# Patient Record
Sex: Male | Born: 1990 | Hispanic: No | Marital: Single | Smoking: Never smoker
Health system: Southern US, Community
[De-identification: ages and names within clinical notes are randomized; demographics above are authoritative.]

---

## 2015-08-21 ENCOUNTER — Other Ambulatory Visit: Payer: Self-pay | Admitting: Infectious Disease

## 2015-08-21 ENCOUNTER — Ambulatory Visit
Admission: RE | Admit: 2015-08-21 | Discharge: 2015-08-21 | Disposition: A | Discharge status: Home / Self Care | Payer: No Typology Code available for payment source | Source: Ambulatory Visit | Attending: Infectious Disease | Admitting: Infectious Disease

## 2015-08-21 DIAGNOSIS — R7611 Nonspecific reaction to tuberculin skin test without active tuberculosis: Secondary | ICD-10-CM

## 2019-01-23 ENCOUNTER — Encounter (HOSPITAL_BASED_OUTPATIENT_CLINIC_OR_DEPARTMENT_OTHER): Payer: Self-pay | Admitting: *Deleted

## 2019-01-23 ENCOUNTER — Emergency Department (HOSPITAL_BASED_OUTPATIENT_CLINIC_OR_DEPARTMENT_OTHER)
Admission: EM | Admit: 2019-01-23 | Discharge: 2019-01-23 | Disposition: A | Discharge status: Home / Self Care | Payer: No Typology Code available for payment source | Attending: Emergency Medicine | Admitting: Emergency Medicine

## 2019-01-23 ENCOUNTER — Other Ambulatory Visit: Payer: Self-pay

## 2019-01-23 ENCOUNTER — Emergency Department (HOSPITAL_BASED_OUTPATIENT_CLINIC_OR_DEPARTMENT_OTHER): Payer: No Typology Code available for payment source

## 2019-01-23 DIAGNOSIS — M25561 Pain in right knee: Secondary | ICD-10-CM | POA: Insufficient documentation

## 2019-01-23 DIAGNOSIS — Y9302 Activity, running: Secondary | ICD-10-CM | POA: Diagnosis not present

## 2019-01-23 DIAGNOSIS — W1842XA Slipping, tripping and stumbling without falling due to stepping into hole or opening, initial encounter: Secondary | ICD-10-CM | POA: Diagnosis not present

## 2019-01-23 DIAGNOSIS — Y998 Other external cause status: Secondary | ICD-10-CM | POA: Insufficient documentation

## 2019-01-23 DIAGNOSIS — Y9289 Other specified places as the place of occurrence of the external cause: Secondary | ICD-10-CM | POA: Insufficient documentation

## 2019-01-23 DIAGNOSIS — W19XXXA Unspecified fall, initial encounter: Secondary | ICD-10-CM

## 2019-01-23 NOTE — Discharge Instructions (Addendum)
You may alternate taking Tylenol and Ibuprofen as needed for pain control. You may take 400-600 mg of ibuprofen every 6 hours and [id number] mg of Tylenol every 6 hours. Do not exceed 4000 mg of Tylenol daily as this can lead to liver damage. Also, make sure to take Ibuprofen with meals as it can cause an upset stomach. Do not take other NSAIDs while taking Ibuprofen such as (Aleve, Naprosyn, Aspirin, Celebrex, etc) and do not take more than the prescribed dose as this can lead to ulcers and bleeding in your GI tract. You may use warm and cold compresses to help with your symptoms.   Please follow up with your primary doctor or an orthopedics within the next 7-10 days for re-evaluation and further treatment of your symptoms if you continue to have pain.   Please return to the ER sooner if you have any new or worsening symptoms.
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
## 2019-09-17 ENCOUNTER — Emergency Department (INDEPENDENT_AMBULATORY_CARE_PROVIDER_SITE_OTHER): Payer: 59

## 2019-09-17 ENCOUNTER — Other Ambulatory Visit: Payer: Self-pay

## 2019-09-17 ENCOUNTER — Emergency Department
Admission: RE | Admit: 2019-09-17 | Discharge: 2019-09-17 | Disposition: A | Discharge status: Home / Self Care | Payer: 59 | Source: Ambulatory Visit

## 2019-09-17 VITALS — BP 123/82 | HR 65 | Temp 98.1°F

## 2019-09-17 DIAGNOSIS — R0781 Pleurodynia: Secondary | ICD-10-CM | POA: Diagnosis not present

## 2019-09-17 DIAGNOSIS — R0789 Other chest pain: Secondary | ICD-10-CM

## 2019-09-17 DIAGNOSIS — Y9368 Activity, volleyball (beach) (court): Secondary | ICD-10-CM | POA: Diagnosis not present

## 2019-09-17 DIAGNOSIS — W1830XA Fall on same level, unspecified, initial encounter: Secondary | ICD-10-CM

## 2019-09-17 MED ORDER — NAPROXEN 500 MG PO TABS: 500.0000 mg | ORAL_TABLET | Freq: Two times a day (BID) | ORAL | 0 refills | Status: AC | PRN

## 2019-09-17 NOTE — Discharge Instructions (Signed)
  Take the prescribed antiinflammatory medication with food to help prevent stomach ulcers and upset.   Use a cool compress 2-3 times daily for 10-15 minutes at a time to help with pain.  Call to schedule an appointment with family medicine in 1 week if not improving, sooner if you develop fever, cough or trouble breathing.

## 2019-09-17 NOTE — ED Provider Notes (Signed)
[facility]    CSN: [id number] Arrival date & time: 09/17/19  1053      History   Chief Complaint Chief Complaint  Patient presents with  . Appointment  . Chest Pain    HPI Nicholas Ford is a 29 y.o. male.   HPI  Nicholas Ford is a 29 y.o. male presenting to UC with c/o left side rib pain for about 3-4 weeks after fall on hard sand while playing beach volleyball.  Pain is aching and sore, 3/10, worse with certain movement. He has taken ibuprofen twice over the last 2 days with no improvement. Denies cough, congestion, fever, or trouble breathing. Denies rash or bruising to the area.    History reviewed. No pertinent past medical history.  There are no problems to display for this patient.   History reviewed. No pertinent surgical history.     Home Medications    Prior to Admission medications   Medication Sig Start Date End Date Taking? Authorizing Provider  naproxen (NAPROSYN) 500 MG tablet Take 1 tablet (500 mg total) by mouth 2 (two) times daily as needed for mild pain or moderate pain. 09/17/19   Lurene Shadow, PA-C    Family History No family history on file.  Social History Social History   Tobacco Use  . Smoking status: Never Smoker  . Smokeless tobacco: Never Used  Substance Use Topics  . Alcohol use: Not Currently  . Drug use: Never     Allergies   Patient has no known allergies.   Review of Systems Review of Systems  Constitutional: Negative for chills and fever.  Respiratory: Negative for chest tightness and shortness of breath.   Cardiovascular: Positive for chest pain (left side). Negative for palpitations.  Skin: Negative for color change and rash.     Physical Exam Triage Vital Signs ED Triage Vitals [09/17/19 1230]  Enc Vitals Group     BP 123/82     Pulse Rate 65     Resp      Temp 98.1 F (36.7 C)     Temp Source Oral     SpO2 99 %     Weight      Height      Head Circumference      Peak Flow        Pain Score 3     Pain Loc      Pain Edu?      Excl. in GC?    No data found.  Updated Vital Signs BP 123/82 (BP Location: Left Arm)   Pulse 65   Temp 98.1 F (36.7 C) (Oral)   SpO2 99%   Visual Acuity Right Eye Distance:   Left Eye Distance:   Bilateral Distance:    Right Eye Near:   Left Eye Near:    Bilateral Near:     Physical Exam Vitals and nursing note reviewed.  Constitutional:      Appearance: He is well-developed.  HENT:     Head: Normocephalic and atraumatic.  Cardiovascular:     Rate and Rhythm: Normal rate and regular rhythm.  Pulmonary:     Effort: Pulmonary effort is normal.     Breath sounds: No decreased breath sounds, wheezing, rhonchi or rales.  Chest:     Chest wall: Tenderness present. No mass, deformity, crepitus or edema.  Musculoskeletal:        General: Normal range of motion.     Cervical back: Normal range of  motion.  Skin:    General: Skin is warm and dry.     Findings: No ecchymosis, erythema or rash.  Neurological:     Mental Status: He is alert and oriented to person, place, and time.  Psychiatric:        Behavior: Behavior normal.      UC Treatments / Results  Labs (all labs ordered are listed, but only abnormal results are displayed) Labs Reviewed - No data to display  EKG   Radiology DG Ribs Unilateral W/Chest Left  Result Date: 09/17/2019 CLINICAL DATA:  LEFT chest and rib pain following fall 3 weeks ago. Initial encounter. EXAM: LEFT RIBS AND CHEST - 3+ VIEW COMPARISON:  08/21/2015 chest radiograph FINDINGS: No fracture or other bone lesions are seen involving the ribs. There is no evidence of pneumothorax or pleural effusion. Both lungs are clear. Heart size and mediastinal contours are within normal limits. IMPRESSION: Negative. Electronically Signed   By: Harmon Pier M.D.   On: 09/17/2019 12:53    Procedures Procedures (including critical care time)  Medications Ordered in UC Medications - No data to  display  Initial Impression / Assessment and Plan / UC Course  I have reviewed the triage vital signs and the nursing notes.  Pertinent labs & imaging results that were available during my care of the patient were reviewed by me and considered in my medical decision making (see chart for details).     Discussed imaging with pt Encouraged symptomatic tx F/u with PCP AVS given  Final Clinical Impressions(s) / UC Diagnoses   Final diagnoses:  Rib pain on left side     Discharge Instructions      Take the prescribed antiinflammatory medication with food to help prevent stomach ulcers and upset.   Use a cool compress 2-3 times daily for 10-15 minutes at a time to help with pain.  Call to schedule an appointment with family medicine in 1 week if not improving, sooner if you develop fever, cough or trouble breathing.     ED Prescriptions    Medication Sig Dispense Auth. Provider   naproxen (NAPROSYN) 500 MG tablet Take 1 tablet (500 mg total) by mouth 2 (two) times daily as needed for mild pain or moderate pain. 30 tablet Lurene Shadow, [place]     I have reviewed the PDMP during this encounter.   Lurene Shadow, [place] 09/17/19 1333

## 2019-09-17 NOTE — ED Triage Notes (Signed)
Patient states that he is having left sided rib pain from a fall about 3-4 weeks ago.  Patient not having SOB, been taking Ibuprofen.

## 2022-01-15 IMAGING — DX DG RIBS W/ CHEST 3+V*L*
4 series · 4 of 4 positions shown · non-contrast
Comparison: 08/21/2015 chest radiograph

CLINICAL DATA: LEFT chest and rib pain following fall 3 weeks ago.
Initial encounter.

EXAM:
LEFT RIBS AND CHEST - 3+ VIEW

[chest pa]
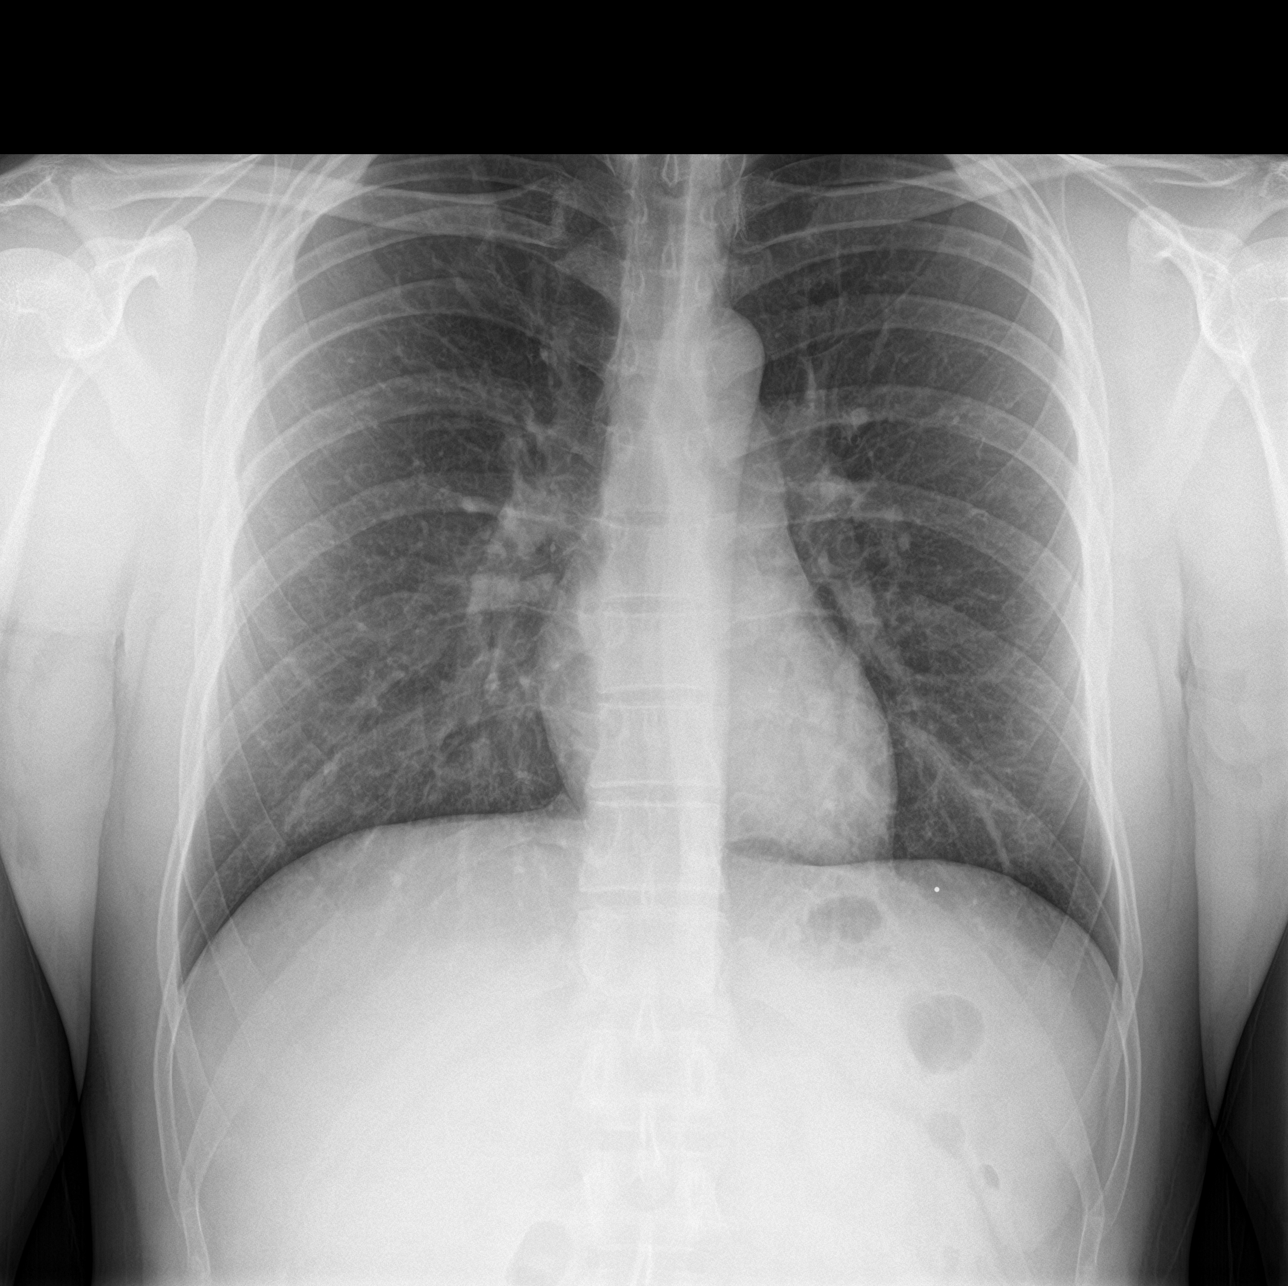

[rib pa (1 of 2)]
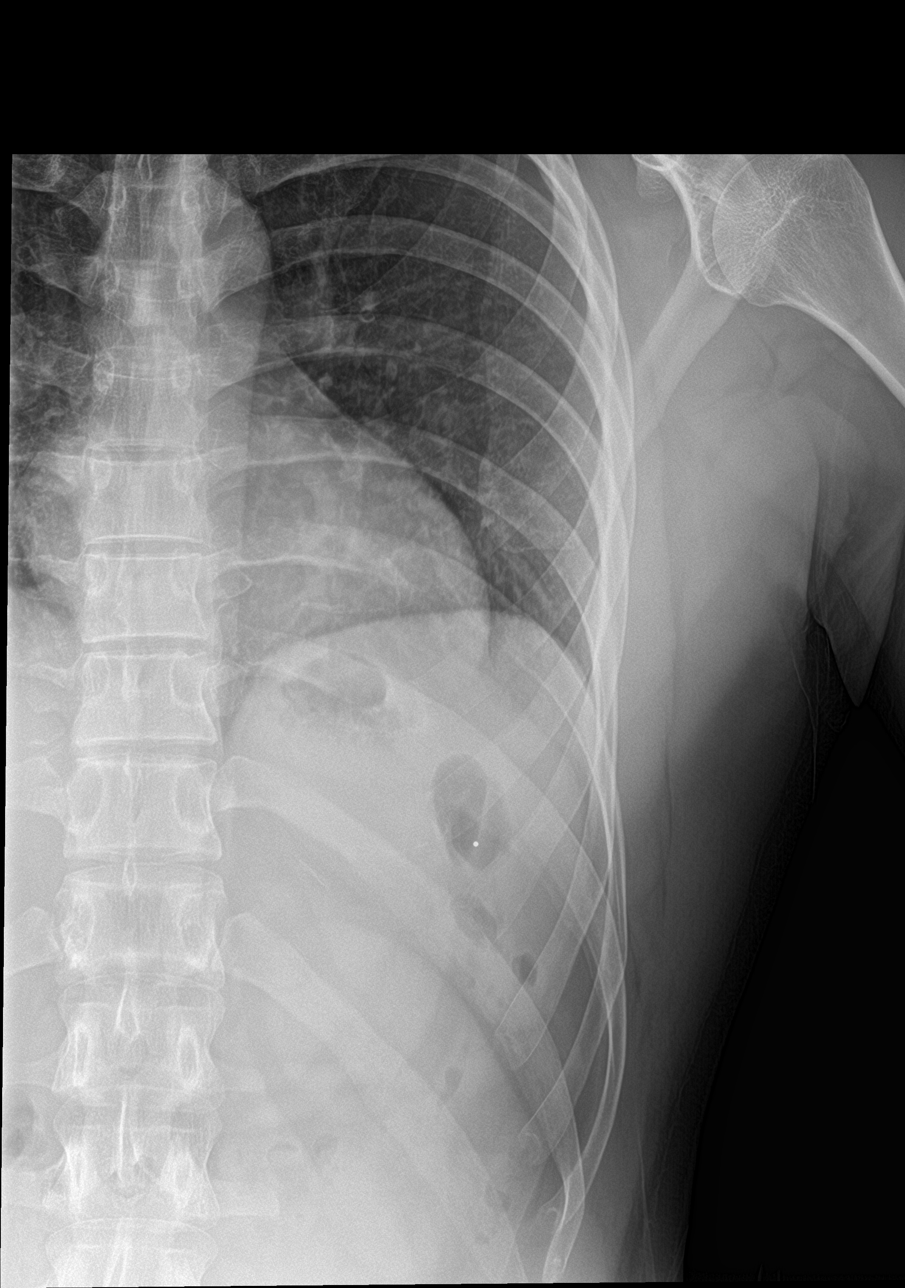

[rib pa (2 of 2)]
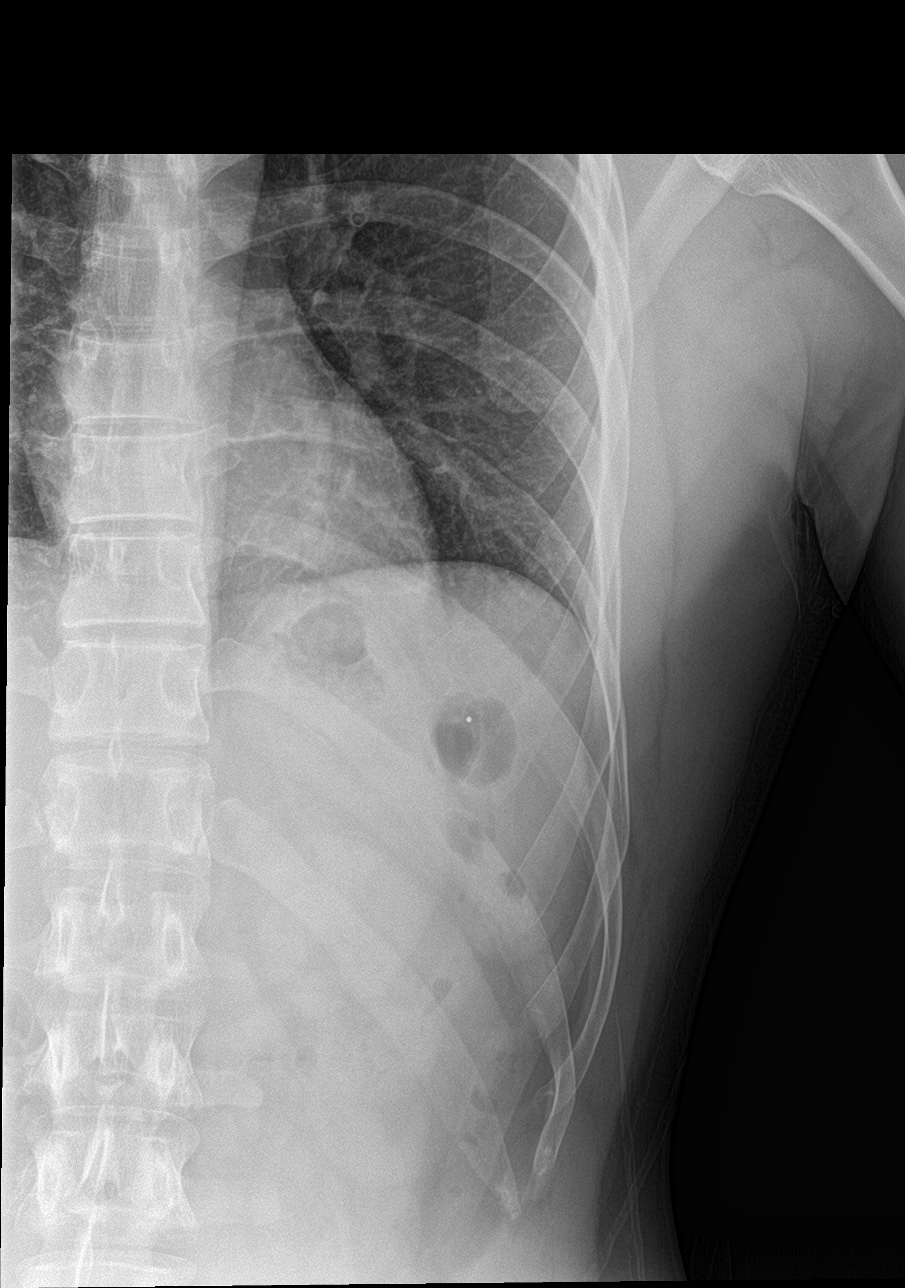

[rib pa obl]
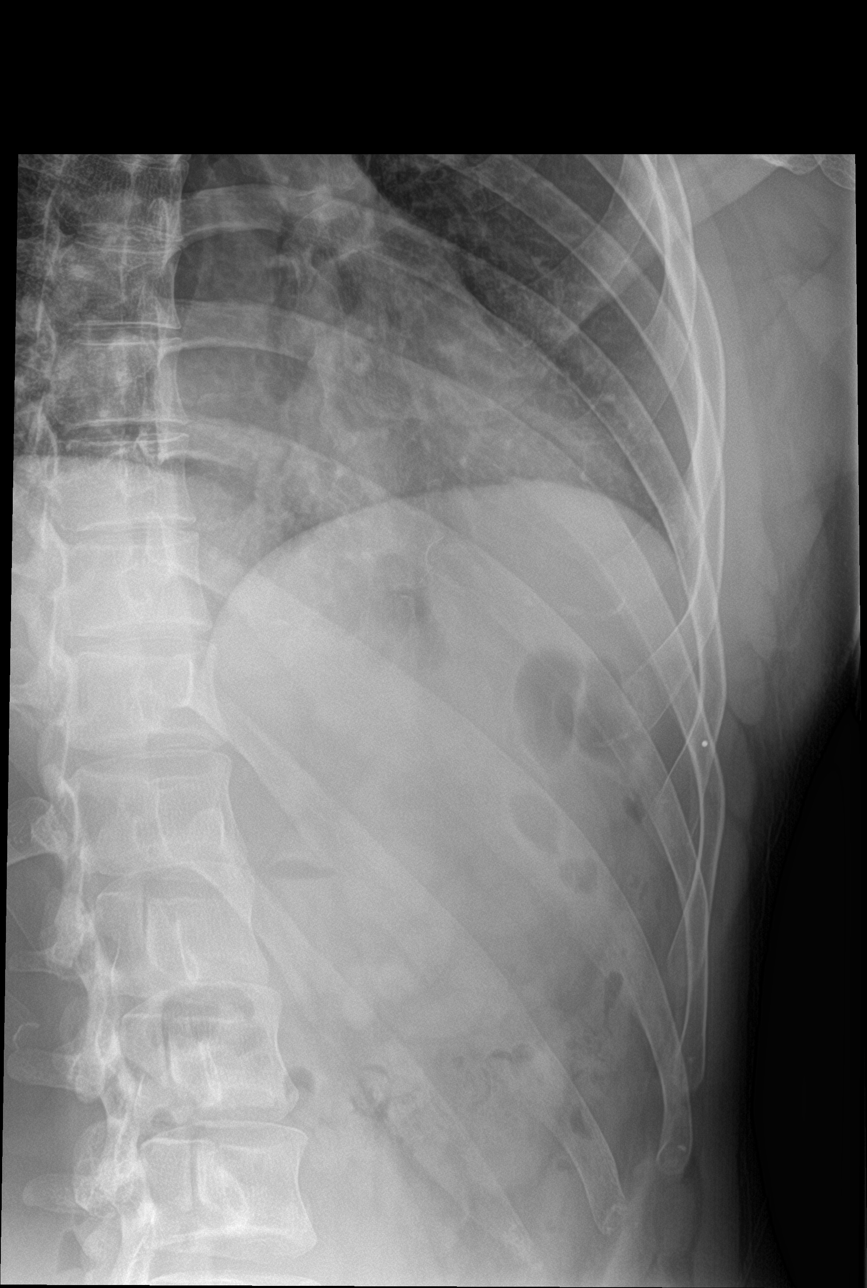

[4 of 4 positions shown; findings below may reference images not displayed]

FINDINGS: No fracture or other bone lesions are seen involving the ribs. There
is no evidence of pneumothorax or pleural effusion. Both lungs are
clear. Heart size and mediastinal contours are within normal limits.
IMPRESSION: Negative.
